# Patient Record
Sex: Male | Born: 1960 | Marital: Married
Health system: Southern US, Community
[De-identification: ages and names within clinical notes are randomized; demographics above are authoritative.]

---

## 2012-08-22 LAB — CBC
HCT: 50.2 % (ref 40.0–52.0)
HGB: 17.4 g/dL (ref 13.0–18.0)
MCHC: 34.7 g/dL (ref 32.0–36.0)
MCV: 91 fL (ref 80–100)
RBC: 5.53 10*6/uL (ref 4.40–5.90)
RDW: 14 % (ref 11.5–14.5)
WBC: 9.2 10*3/uL (ref 3.8–10.6)

## 2012-08-22 LAB — COMPREHENSIVE METABOLIC PANEL
Albumin: 4.2 g/dL (ref 3.4–5.0)
Alkaline Phosphatase: 165 U/L — ABNORMAL HIGH (ref 50–136)
BUN: 19 mg/dL — ABNORMAL HIGH (ref 7–18)
Bilirubin,Total: 0.3 mg/dL (ref 0.2–1.0)
Calcium, Total: 9.2 mg/dL (ref 8.5–10.1)
Chloride: 104 mmol/L (ref 98–107)
Co2: 29 mmol/L (ref 21–32)
EGFR (Non-African Amer.): >60
Glucose: 136 mg/dL — ABNORMAL HIGH (ref 65–99)
Osmolality: 286 (ref 275–301)
SGOT(AST): 38 U/L — ABNORMAL HIGH (ref 15–37)
Sodium: 141 mmol/L (ref 136–145)
Total Protein: 7.7 g/dL (ref 6.4–8.2)

## 2012-08-22 LAB — PROTIME-INR: INR: 0.9

## 2012-08-23 ENCOUNTER — Inpatient Hospital Stay: Payer: Self-pay | Admitting: Internal Medicine

## 2012-08-23 DIAGNOSIS — R079 Chest pain, unspecified: Secondary | ICD-10-CM

## 2012-08-23 DIAGNOSIS — R7989 Other specified abnormal findings of blood chemistry: Secondary | ICD-10-CM

## 2012-08-23 LAB — CK TOTAL AND CKMB (NOT AT ARMC)
CK, Total: 71 U/L (ref 35–232)
CK, Total: 78 U/L (ref 35–232)

## 2012-08-23 LAB — LIPID PANEL
Cholesterol: 226 mg/dL — ABNORMAL HIGH (ref 0–200)
Ldl Cholesterol, Calc: 144 mg/dL — ABNORMAL HIGH (ref 0–100)

## 2013-07-21 IMAGING — CR DG CHEST 1V PORT
1 series · 1 of 1 positions shown · non-contrast
Comparison: none

REASON FOR EXAM: HTN
COMMENTS:

[ap]
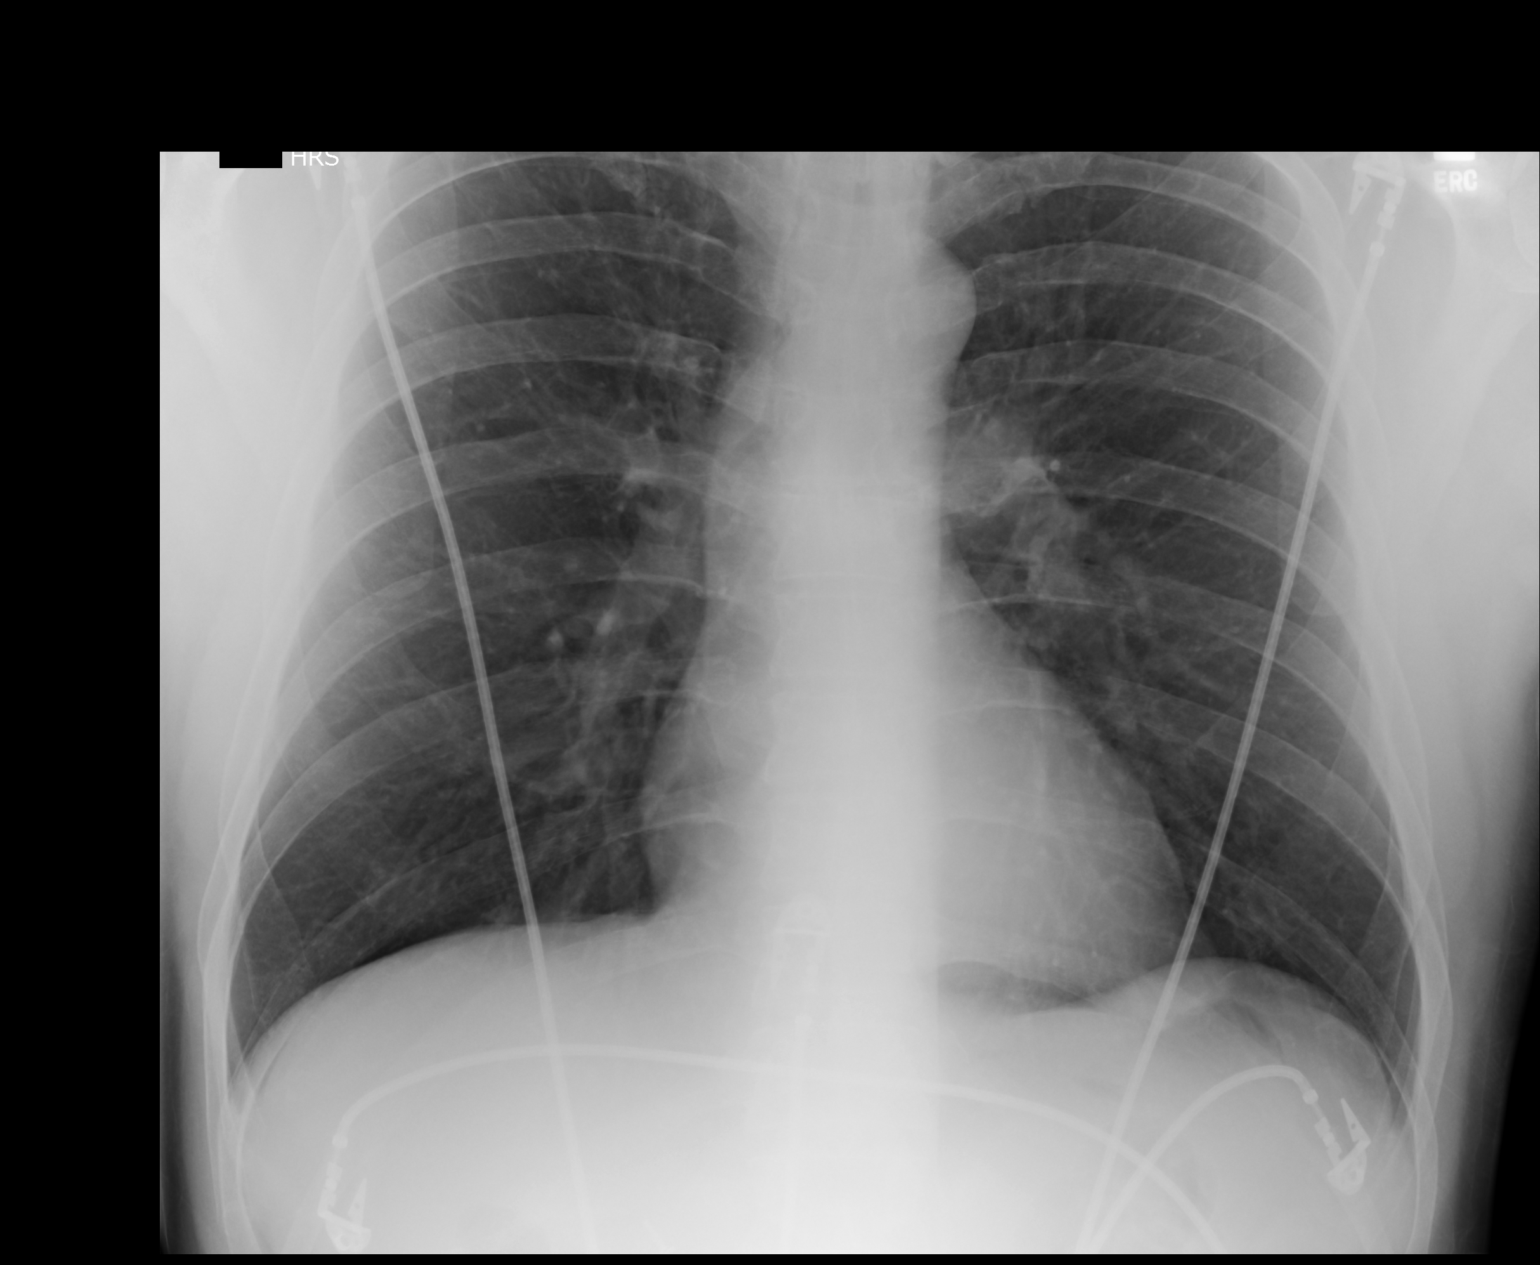

[1 of 1 positions shown; findings below may reference images not displayed]

PROCEDURE:     DXR - DXR PORTABLE CHEST SINGLE VIEW  - August 22, 2012 [DATE]

RESULT:     Cardiac monitoring electrodes are present. The lungs are clear.
The heart and pulmonary vessels are normal. The bony and mediastinal
structures are unremarkable. There is no effusion. There is no pneumothorax
or evidence of congestive failure.
IMPRESSION: No acute cardiopulmonary disease.

[REDACTED]

## 2014-12-27 NOTE — Consult Note (Signed)
General Aspect 54 y/o WM with PMH of dyspepsia for which he is taking Zantac prn, previous gallbladder disease,  s/p lap chole, Presenting with flushing sensation in trunk and face, diarrhea, palpitations for a few seconds, malaise, presenting to the ER.  Cardiology was consulted for troponin of 0.4.   He also describe 2 weeks history of loose stool or some diarrhea 1- 2 episodes a day. Some belching. BP noted elevated 150 - 160 systolic at home in setting of upset stomach. He decided to come to the ER as he was very anxious about his symptoms.  No chest pain. At baseline, he is active, climbs ladders, Personnel officerelectrician. no symptoms of chest pain or SOB at work.  Follow up troponin also 0.4, no change in CK or CKMB.    Present Illness . Social: no smoking, no significant ETOH, works as an Personnel officerelectrician, married. has children.  Family hx: Father was born premature, had "arterial problems", "first heart attack at age 55, died in his 7460s"   Physical Exam:   GEN well developed, well nourished, no acute distress   Review of Systems:   Subjective/Chief Complaint GI upset, flush, diarrhea, malaise    General: Weakness    Skin: No Complaints    ENT: No Complaints    Eyes: No Complaints    Neck: No Complaints    Respiratory: No Complaints    Cardiovascular: No Complaints    Gastrointestinal: Heartburn  Diarrhea    Genitourinary: No Complaints    Vascular: No Complaints    Musculoskeletal: No Complaints    Neurologic: No Complaints    Hematologic: No Complaints    Endocrine: No Complaints    Psychiatric: No Complaints    Review of Systems: All other systems were reviewed and found to be negative    Medications/Allergies Reviewed Medications/Allergies reviewed   Home Medications: Medication Instructions Status  Zantac 75  orally , As Needed- for Indigestion, Heartburn  Active   Lab Results:  Routine Chem:  15-Dec-13 06:22    Cholesterol, Serum  226   Triglycerides,  Serum 159   HDL (INHOUSE) 50   VLDL Cholesterol Calculated 32   LDL Cholesterol Calculated  144 (Result(s) reported on 23 Aug 2012 at 11:24AM.)   Potassium, Serum 3.9 (Result(s) reported on 23 Aug 2012 at 09:13AM.)   Result Comment troponin - high value called [id number]  - RESULTS VERIFIED BY REPEAT TESTING.  - jb  Result(s) reported on 23 Aug 2012 at 07:23AM.  Cardiac:  15-Dec-13 06:22    CK, Total 71   CPK-MB, Serum 0.7 (Result(s) reported on 23 Aug 2012 at 07:06AM.)   Troponin I  0.41 (0.00-0.05 0.05 ng/mL or less: NEGATIVE  Repeat testing in 3-6 hrs  if clinically indicated. >0.05 ng/mL: POTENTIAL  MYOCARDIAL INJURY. Repeat  testing in 3-6 hrs if  clinically indicated. NOTE: An increase or decrease  of 30% or more on serial  testing suggests a  clinically important change)  Routine Coag:  15-Dec-13 06:22    Activated PTT (APTT)  76.8 (A HCT value >55% may artifactually increase the APTT. In one study, the increase was an average of 19%. Reference: "Effect on Routine and Special Coagulation Testing Values of Citrate Anticoagulant Adjustment in Patients with High HCT Values." American Journal of Clinical Pathology 2006;126:400-405.)   EKG:   Interpretation EKG shows NSR with rate 84 bpm, nonspecific ST ABN    NKA: None  Vital Signs/Nurse's Notes: **Vital Signs.:   15-Dec-13 12:00  Vital Signs Type Routine   Pulse Pulse 72   Pulse source if not from Vital Sign Device per cardiac monitor   Respirations Respirations 21   Systolic BP Systolic BP 137   Diastolic BP (mmHg) Diastolic BP (mmHg) 89   Mean BP 105   Pulse Ox % Pulse Ox % 97   Oxygen Delivery Room Air/ 21 %   Pulse Ox Heart Rate 70     Impression 54 y/o WM with PMH of dyspepsia for which he is taking Zantac prn, previous gallbladder disease,  s/p lap chole, Presenting with flushing sensation in trunk and face, diarrhea, palpitations for a few seconds, malaise, presenting to the ER.  Cardiology was  consulted for troponin of 0.4.  1) Elevated cardiac enz: Troponin minimally elevated trending down. CK and CKMB normal. Symptoms are atypical in nature, no EKG changes. nonsmoker, no diabetes. No angina sx. --Would check third set of enz. If unchanged from first and second set, would d/c home with outpt treadmill stress test. Suspect anxiety playing a role in his presentation -Could start asa 81 mg daily possibly hold on metoprolol as BP was low this AM  2) GERD Suggest starting omeprazole daily.  3) Palpitations: secondary to anxiety of upset stomach/GI issues. Brief episode  Needs PMD   Electronic Signatures: Julien Nordmann (MD)  (Signed 15-Dec-13 12:40)  Authored: General Aspect/Present Illness, History and Physical Exam, Review of System, Home Medications, Labs, EKG , Allergies, Vital Signs/Nurse's Notes, Impression/Plan   Last Updated: 15-Dec-13 12:40 by Julien Nordmann (MD)

## 2014-12-30 NOTE — Discharge Summary (Signed)
PATIENT NAME:  Bruce Munoz, Bruce Munoz DATE OF BIRTH:  1961/03/02  DATE OF ADMISSION:  08/23/2012 DATE OF DISCHARGE:  08/23/2012  PRESENTING COMPLAINT: Epigastric discomfort.   DISCHARGE DIAGNOSES: 1. Gastroesophageal reflux disease/acute gastritis.  2. Elevated/borderline troponin, appears demand ischemia.  3. Hypertension, mild, new diagnosis.   CONDITION ON DISCHARGE: Fair. Vitals stable. Blood pressure is 137/89, saturation 97% on room air and pulse is 72.   CONSULTANTS: Julien Nordmannimothy Gollan, MD - Cardiology.  DISCHARGE FOLLOWUP: Follow up with Dr. Mariah MillingGollan next week for outpatient stress test.   DISCHARGE MEDICATIONS: 1. Zantac 75 mg orally as needed for indigestion.  2. Omeprazole 20 mg twice a day. 3. Aspirin 81 mg p.o. daily, enteric coated.  RESULTS: Cardiac enzymes, first set is 0.48, second set is 0.41. CK and CK/MB are negative. Cholesterol is 226. LDL is 144, triglyceride 159 and HDL is 50. CBC within normal limits. D-dimer is less than 0.22.   BRIEF SUMMARY OF HOSPITAL COURSE: Bruce Munoz is a 54 year old Caucasian gentleman with no significant past medical history other than previous gallbladder disease status post laparoscopic cholecystectomy who came in with flushing sensation in his face and trunk along with some palpitations. He was admitted with:  1. Elevated cardiac enzymes. Troponin minimally elevated, which is trending down. CK and CK-MB are normal. Symptoms are atypical in nature. No EKG changes. Nonsmoker. No other risk factors other than family history of premature CAD. The patient was started initially on heparin drip, which has been discontinued. His Plavix also has been discontinued. He will be continued on low dose beta blockers for mild elevated blood pressure. He was seen by Dr. Mariah MillingGollan who recommends to perform a third set of cardiac enzymes and if remains stable the patient can be discharged home from cardiology standpoint and follow up with Dr. Mariah MillingGollan as an  outpatient for stress test. Recommend starting 81 mg of aspirin.  2. GERD. We will continue the patient on omeprazole daily.  3. Palpitations secondary to anxiety along with some GI issues.  4. Elevated high blood pressure on admission without diagnosis of hypertension. The patient was started on metoprolol, however, blood pressure has settled down. We will have the patient monitor blood pressure at home and follow up with Dr. Mariah MillingGollan. If continues to remain elevated, we will defer to primary care physician who will ask Dr. Mariah MillingGollan to start the patient on antihypertensive medications as outpatient.   TIME SPENT: 40 minutes. ____________________________ Wylie HailSona A. Allena KatzPatel, MD sap:sb D: 08/23/2012 13:12:00 ET T: 08/24/2012 11:42:39 ET JOB#: [id number]  cc: Zelda Reames A. Allena KatzPatel, MD, <Dictator> Antonieta Ibaimothy J. Gollan, MD Willow OraSONA A Udell Mazzocco MD ELECTRONICALLY SIGNED 09/09/2012 10:33
